# Patient Record
Sex: Male | Born: 1965 | Race: Black or African American | Hispanic: No | Marital: Single | Smoking: Former smoker
Health system: Southern US, Community
[De-identification: ages and names within clinical notes are randomized; demographics above are authoritative.]

## PROBLEM LIST (undated history)

## (undated) DIAGNOSIS — M1990 Unspecified osteoarthritis, unspecified site: Secondary | ICD-10-CM

## (undated) DIAGNOSIS — F32A Depression, unspecified: Secondary | ICD-10-CM

## (undated) DIAGNOSIS — E119 Type 2 diabetes mellitus without complications: Secondary | ICD-10-CM

## (undated) DIAGNOSIS — H409 Unspecified glaucoma: Secondary | ICD-10-CM

## (undated) DIAGNOSIS — I10 Essential (primary) hypertension: Secondary | ICD-10-CM

## (undated) DIAGNOSIS — F419 Anxiety disorder, unspecified: Secondary | ICD-10-CM

## (undated) DIAGNOSIS — T7840XA Allergy, unspecified, initial encounter: Secondary | ICD-10-CM

## (undated) DIAGNOSIS — E785 Hyperlipidemia, unspecified: Secondary | ICD-10-CM

## (undated) HISTORY — DX: Depression, unspecified: F32.A

## (undated) HISTORY — PX: ANKLE SURGERY: SHX546

## (undated) HISTORY — DX: Essential (primary) hypertension: I10

## (undated) HISTORY — PX: CORNEAL TRANSPLANT: SHX108

## (undated) HISTORY — DX: Anxiety disorder, unspecified: F41.9

## (undated) HISTORY — DX: Unspecified glaucoma: H40.9

## (undated) HISTORY — DX: Hyperlipidemia, unspecified: E78.5

## (undated) HISTORY — DX: Type 2 diabetes mellitus without complications: E11.9

## (undated) HISTORY — DX: Allergy, unspecified, initial encounter: T78.40XA

## (undated) HISTORY — PX: STOMACH SURGERY: SHX791

## (undated) HISTORY — DX: Unspecified osteoarthritis, unspecified site: M19.90

---

## 2020-01-24 LAB — EXTERNAL GENERIC LAB PROCEDURE

## 2020-03-09 LAB — EXTERNAL GENERIC LAB PROCEDURE

## 2020-03-19 LAB — EXTERNAL GENERIC LAB PROCEDURE: COLOGUARD: NEGATIVE

## 2021-11-14 ENCOUNTER — Ambulatory Visit
Admission: RE | Admit: 2021-11-14 | Discharge: 2021-11-14 | Disposition: A | Discharge status: Home / Self Care | Payer: Medicare HMO | Source: Ambulatory Visit | Attending: Internal Medicine | Admitting: Internal Medicine

## 2021-11-14 ENCOUNTER — Other Ambulatory Visit: Payer: Self-pay | Admitting: Internal Medicine

## 2021-11-14 DIAGNOSIS — R053 Chronic cough: Secondary | ICD-10-CM

## 2022-08-08 ENCOUNTER — Encounter (HOSPITAL_BASED_OUTPATIENT_CLINIC_OR_DEPARTMENT_OTHER): Payer: Self-pay

## 2022-08-08 DIAGNOSIS — R5383 Other fatigue: Secondary | ICD-10-CM

## 2022-08-08 DIAGNOSIS — R0683 Snoring: Secondary | ICD-10-CM

## 2022-08-14 ENCOUNTER — Ambulatory Visit (INDEPENDENT_AMBULATORY_CARE_PROVIDER_SITE_OTHER): Payer: Medicare HMO | Admitting: Podiatry

## 2022-08-14 ENCOUNTER — Encounter: Payer: Self-pay | Admitting: Podiatry

## 2022-08-14 DIAGNOSIS — Z794 Long term (current) use of insulin: Secondary | ICD-10-CM

## 2022-08-14 DIAGNOSIS — E089 Diabetes mellitus due to underlying condition without complications: Secondary | ICD-10-CM

## 2022-08-14 NOTE — Progress Notes (Signed)
  Subjective:  Patient ID: Ethan Hernandez, male    DOB: 1966/04/21,  MRN: [id number] HPI Chief Complaint  Patient presents with   Diabetes    Diabetic Foot Exam - PCP referred, last A1c was "3.2", no concerns per patient, unsure what the need of it was for PCP to send, did Rx'd clobetasol-but it helped the spot that was itchy   New Patient (Initial Visit)    57 y.o. male presents with the above complaint.   ROS: Denies fever chills nausea vomit muscle aches pains calf pain back pain chest pain shortness of breath.  No past medical history on file.   Current Outpatient Medications:    ACCU-CHEK GUIDE test strip, daily., Disp: , Rfl:    albuterol (VENTOLIN HFA) 108 (90 Base) MCG/ACT inhaler, Inhale into the lungs., Disp: , Rfl:    celecoxib (CELEBREX) 200 MG capsule, Take 200 mg by mouth daily., Disp: , Rfl:    clobetasol ointment (TEMOVATE) 7.37 %, Apply 1 Application topically 2 (two) times daily., Disp: , Rfl:    losartan (COZAAR) 50 MG tablet, Take 50 mg by mouth daily., Disp: , Rfl:    tamsulosin (FLOMAX) 0.4 MG CAPS capsule, Take 0.4 mg by mouth daily., Disp: , Rfl:    amLODipine (NORVASC) 10 MG tablet, Take 10 mg by mouth daily., Disp: , Rfl:    escitalopram (LEXAPRO) 10 MG tablet, Take 10 mg by mouth daily., Disp: , Rfl:    metFORMIN (GLUCOPHAGE) 500 MG tablet, Take 1,000 mg by mouth 2 (two) times daily., Disp: , Rfl:    montelukast (SINGULAIR) 10 MG tablet, Take 10 mg by mouth daily., Disp: , Rfl:    rosuvastatin (CRESTOR) 10 MG tablet, Take 10 mg by mouth at bedtime., Disp: , Rfl:   Allergies  Allergen Reactions   Lactose Intolerance (Gi)    Shellfish Allergy    Review of Systems Objective:  There were no vitals filed for this visit.  General: Well developed, nourished, in no acute distress, alert and oriented x3   Dermatological: Skin is warm, dry and supple bilateral. Nails x 10 are well maintained; remaining integument appears unremarkable at this time. There are  no open sores, no preulcerative lesions, no rash or signs of infection present.  Postinflammatory hyperpigmentation to the anterior leg left and the lateral foot right otherwise the nails are in good shape mild xerosis.  Vascular: Dorsalis Pedis artery and Posterior Tibial artery pedal pulses are 2/4 bilateral with immedate capillary fill time. Pedal hair growth present. No varicosities and no lower extremity edema present bilateral.   Neruologic: Grossly intact via light touch bilateral. Vibratory intact via tuning fork bilateral. Protective threshold with Semmes Wienstein monofilament intact to all pedal sites bilateral. Patellar and Achilles deep tendon reflexes 2+ bilateral. No Babinski or clonus noted bilateral.   Musculoskeletal: No gross boney pedal deformities bilateral. No pain, crepitus, or limitation noted with foot and ankle range of motion bilateral. Muscular strength 5/5 in all groups tested bilateral.  Gait: Unassisted, Nonantalgic.    Radiographs:  None taken  Assessment & Plan:   Assessment: Diabetes mellitus with no complications  Plan: Discussed appropriate shoe gear stretching exercise ice therapy sugar modifications we will follow-up with me in 6 months.  Diabetic education     Lasundra Hascall T. [place], [place]

## 2022-08-31 ENCOUNTER — Encounter (HOSPITAL_BASED_OUTPATIENT_CLINIC_OR_DEPARTMENT_OTHER): Payer: Medicare HMO | Admitting: Internal Medicine

## 2023-01-23 ENCOUNTER — Encounter: Payer: Self-pay | Admitting: Internal Medicine

## 2023-02-06 ENCOUNTER — Ambulatory Visit (AMBULATORY_SURGERY_CENTER): Payer: Medicare HMO | Admitting: *Deleted

## 2023-02-06 ENCOUNTER — Telehealth: Payer: Self-pay | Admitting: *Deleted

## 2023-02-06 VITALS — Ht 69.0 in | Wt 312.0 lb

## 2023-02-06 DIAGNOSIS — Z1211 Encounter for screening for malignant neoplasm of colon: Secondary | ICD-10-CM

## 2023-02-06 MED ORDER — PEG 3350-KCL-NA BICARB-NACL 420 G PO SOLR: 4000.0000 mL | Freq: Once | ORAL | 0 refills | Status: AC

## 2023-02-06 NOTE — Progress Notes (Signed)
Pt's name and DOB verified at the beginning of the pre-visit.  Pt denies any difficulty with ambulating,sitting, laying down or rolling side to side Gave both [facility] main # and MD on call # prior to instructions.  No egg or soy allergy known to patient  No issues known to pt with past sedation with any surgeries or procedures Pt denies having issues being intubated Pt has no issues moving head neck or swallowing No FH of Malignant Hyperthermia Pt is not on diet pills Pt is not on home 02  Pt is not on blood thinners  Pt denies issues with constipation  Pt is not on dialysis Pt denise any abnormal heart rhythms  Pt denies any upcoming cardiac testing Pt encouraged to use to use Singlecare or Goodrx to reduce cost  Patient's chart reviewed by Cathlyn Parsons CNRA prior to pre-visit and patient appropriate for the [facility].  Pre-visit completed and red dot placed by patient's name on their procedure day (on provider's schedule).  . Visit by phone Pt states weight is 312 lb Instructed pt why it is important to and  to call if they have any changes in health or new medications. Directed them to the # given and on instructions.   Pt states they will.  Instructions reviewed with pt and pt states understanding. Instructed to review again prior to procedure. Pt states they will.  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

## 2023-02-06 NOTE — Telephone Encounter (Signed)
Attempt to reach pt for pre-visit. Someone answered the phone then when RN said hello they hung up. Will try again. Only 1# listed in profile for pt.

## 2023-02-07 ENCOUNTER — Encounter: Payer: Self-pay | Admitting: Internal Medicine

## 2023-02-12 ENCOUNTER — Ambulatory Visit: Payer: Medicare HMO | Admitting: Podiatry

## 2023-02-26 ENCOUNTER — Encounter: Payer: Medicare HMO | Admitting: Internal Medicine

## 2023-02-26 ENCOUNTER — Telehealth: Payer: Self-pay | Admitting: Internal Medicine

## 2023-02-26 NOTE — Telephone Encounter (Signed)
PT canceled colonoscopy with Dr. Marina Goodell today at 4. PT has not prepped at all

## 2023-05-20 IMAGING — CR DG CHEST 2V
2 series · 2 of 2 positions shown · non-contrast
Comparison: None Available.

CLINICAL DATA: Chronic cough.

EXAM:
CHEST - 2 VIEW

[w chest pa *]
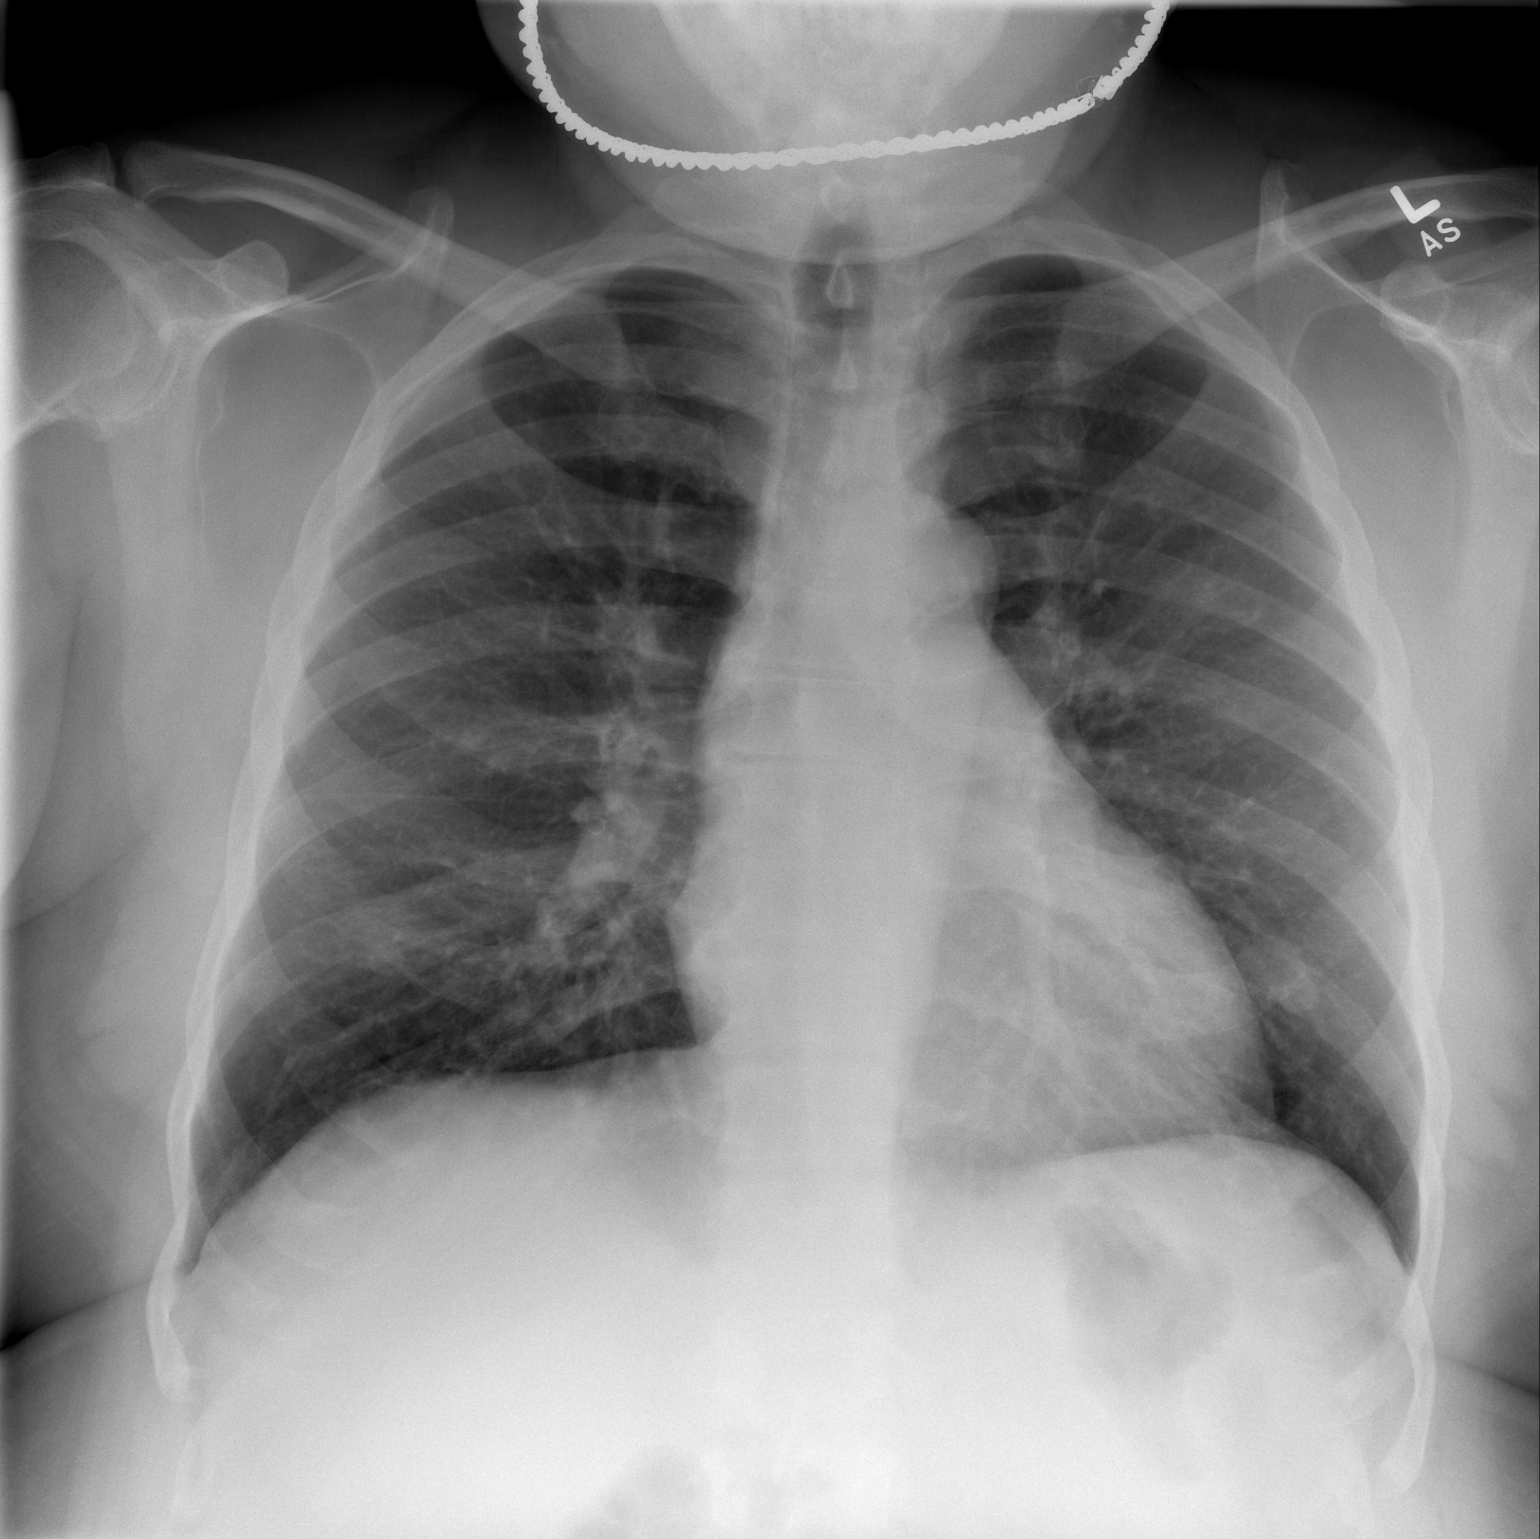

[w chest lat *]
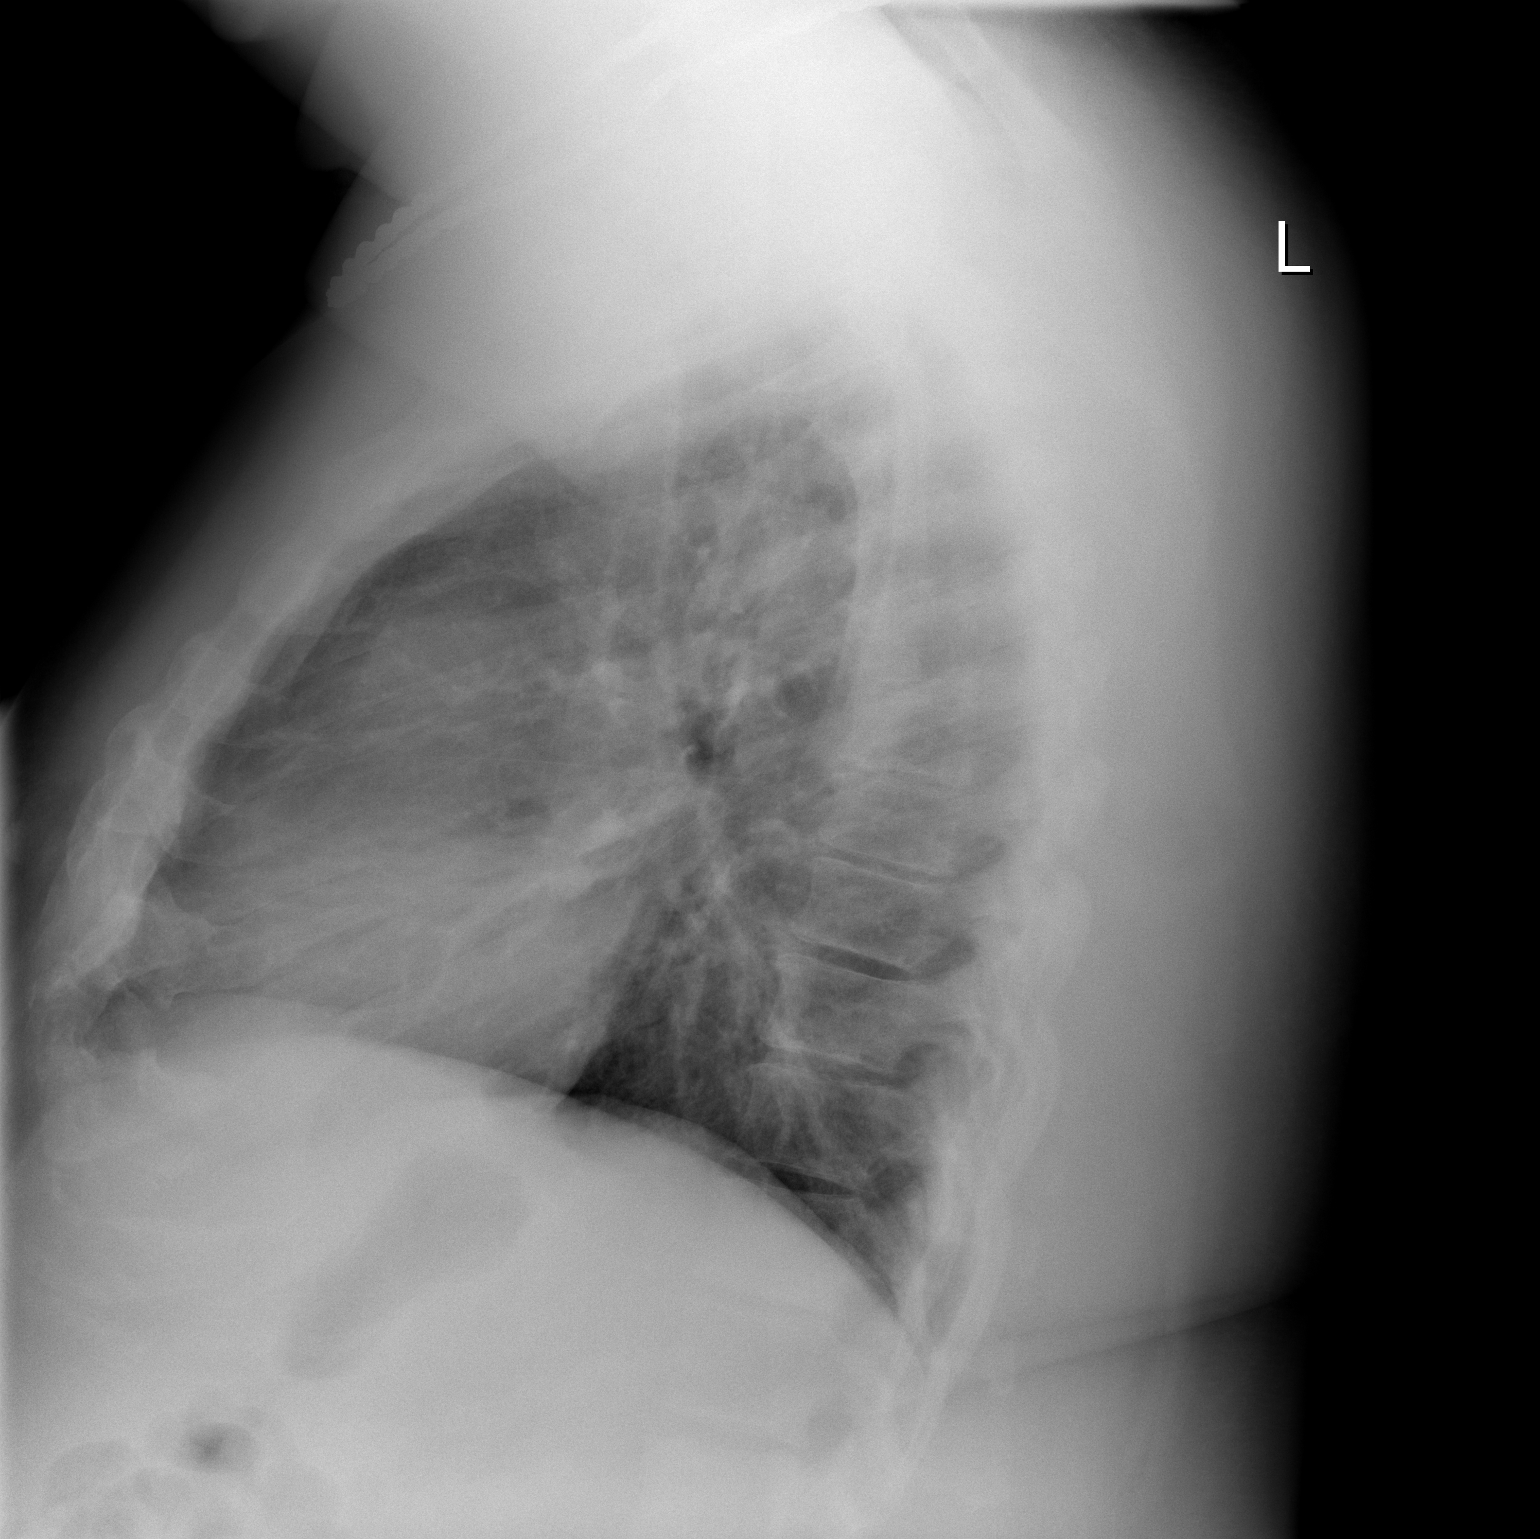

[2 of 2 positions shown; findings below may reference images not displayed]

FINDINGS: The heart size and mediastinal contours are within normal limits.
Both lungs are clear. The visualized skeletal structures are
unremarkable.
IMPRESSION: No active cardiopulmonary disease.

## 2023-06-24 LAB — EXTERNAL GENERIC LAB PROCEDURE: COLOGUARD: NEGATIVE
